# Patient Record
Sex: Male | Born: 1950 | Race: White | Hispanic: No | Marital: Married | Smoking: Current some day smoker
Health system: Southern US, Community
[De-identification: ages and names within clinical notes are randomized; demographics above are authoritative.]

## PROBLEM LIST (undated history)

## (undated) DIAGNOSIS — F329 Major depressive disorder, single episode, unspecified: Secondary | ICD-10-CM

## (undated) DIAGNOSIS — F32A Depression, unspecified: Secondary | ICD-10-CM

## (undated) DIAGNOSIS — F419 Anxiety disorder, unspecified: Secondary | ICD-10-CM

## (undated) HISTORY — PX: APPENDECTOMY: SHX54

---
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
## 2019-12-27 ENCOUNTER — Emergency Department: Payer: Medicare Other

## 2019-12-27 ENCOUNTER — Encounter: Payer: Self-pay | Admitting: Emergency Medicine

## 2019-12-27 ENCOUNTER — Emergency Department
Admission: EM | Admit: 2019-12-27 | Discharge: 2019-12-29 | Disposition: A | Discharge status: Home / Self Care | Payer: Medicare Other | Attending: Emergency Medicine | Admitting: Emergency Medicine

## 2019-12-27 ENCOUNTER — Other Ambulatory Visit: Payer: Self-pay

## 2019-12-27 DIAGNOSIS — Y939 Activity, unspecified: Secondary | ICD-10-CM | POA: Diagnosis not present

## 2019-12-27 DIAGNOSIS — F329 Major depressive disorder, single episode, unspecified: Secondary | ICD-10-CM | POA: Diagnosis not present

## 2019-12-27 DIAGNOSIS — R4182 Altered mental status, unspecified: Secondary | ICD-10-CM | POA: Diagnosis present

## 2019-12-27 DIAGNOSIS — Y929 Unspecified place or not applicable: Secondary | ICD-10-CM | POA: Diagnosis not present

## 2019-12-27 DIAGNOSIS — Y999 Unspecified external cause status: Secondary | ICD-10-CM | POA: Diagnosis not present

## 2019-12-27 DIAGNOSIS — S0990XA Unspecified injury of head, initial encounter: Secondary | ICD-10-CM | POA: Diagnosis present

## 2019-12-27 DIAGNOSIS — Z20822 Contact with and (suspected) exposure to covid-19: Secondary | ICD-10-CM | POA: Insufficient documentation

## 2019-12-27 DIAGNOSIS — S0101XA Laceration without foreign body of scalp, initial encounter: Secondary | ICD-10-CM | POA: Insufficient documentation

## 2019-12-27 DIAGNOSIS — W19XXXA Unspecified fall, initial encounter: Secondary | ICD-10-CM | POA: Insufficient documentation

## 2019-12-27 DIAGNOSIS — F17200 Nicotine dependence, unspecified, uncomplicated: Secondary | ICD-10-CM | POA: Diagnosis not present

## 2019-12-27 DIAGNOSIS — R443 Hallucinations, unspecified: Secondary | ICD-10-CM | POA: Diagnosis not present

## 2019-12-27 HISTORY — DX: Depression, unspecified: F32.A

## 2019-12-27 HISTORY — DX: Anxiety disorder, unspecified: F41.9

## 2019-12-27 LAB — RESPIRATORY PANEL BY RT PCR (FLU A&B, COVID)
Influenza A by PCR: NEGATIVE
Influenza B by PCR: NEGATIVE
SARS Coronavirus 2 by RT PCR: NEGATIVE

## 2019-12-27 LAB — COMPREHENSIVE METABOLIC PANEL
ALT: 21 U/L (ref 0–44)
AST: 29 U/L (ref 15–41)
Albumin: 3.8 g/dL (ref 3.5–5.0)
Alkaline Phosphatase: 75 U/L (ref 38–126)
Anion gap: 8 (ref 5–15)
BUN: 18 mg/dL (ref 8–23)
CO2: 27 mmol/L (ref 22–32)
Calcium: 9 mg/dL (ref 8.9–10.3)
Chloride: 100 mmol/L (ref 98–111)
Creatinine, Ser: 1.21 mg/dL (ref 0.61–1.24)
GFR calc Af Amer: >60 mL/min (ref >60)
GFR calc non Af Amer: >60 mL/min (ref >60)
Glucose, Bld: 95 mg/dL (ref 70–99)
Potassium: 4.6 mmol/L (ref 3.5–5.1)
Sodium: 135 mmol/L (ref 135–145)
Total Bilirubin: 0.9 mg/dL (ref 0.3–1.2)
Total Protein: 8.1 g/dL (ref 6.5–8.1)

## 2019-12-27 LAB — URINALYSIS, COMPLETE (UACMP) WITH MICROSCOPIC
Bacteria, UA: NONE SEEN
Bilirubin Urine: NEGATIVE
Glucose, UA: NEGATIVE mg/dL
Hgb urine dipstick: NEGATIVE
Ketones, ur: NEGATIVE mg/dL
Leukocytes,Ua: NEGATIVE
Nitrite: NEGATIVE
Protein, ur: NEGATIVE mg/dL
Specific Gravity, Urine: 1.015 (ref 1.005–1.030)
Squamous Epithelial / HPF: NONE SEEN (ref 0–5)
pH: 6 (ref 5.0–8.0)

## 2019-12-27 LAB — CBC WITH DIFFERENTIAL/PLATELET
Abs Immature Granulocytes: 0.03 10*3/uL (ref 0.00–0.07)
Basophils Absolute: 0.1 10*3/uL (ref 0.0–0.1)
Basophils Relative: 1 %
Eosinophils Absolute: 0.1 10*3/uL (ref 0.0–0.5)
Eosinophils Relative: 1 %
HCT: 33.9 % — ABNORMAL LOW (ref 39.0–52.0)
Hemoglobin: 11.2 g/dL — ABNORMAL LOW (ref 13.0–17.0)
Immature Granulocytes: 0 %
Lymphocytes Relative: 27 %
Lymphs Abs: 2.1 10*3/uL (ref 0.7–4.0)
MCH: 28.9 pg (ref 26.0–34.0)
MCHC: 33 g/dL (ref 30.0–36.0)
MCV: 87.4 fL (ref 80.0–100.0)
Monocytes Absolute: 0.6 10*3/uL (ref 0.1–1.0)
Monocytes Relative: 8 %
Neutro Abs: 4.7 10*3/uL (ref 1.7–7.7)
Neutrophils Relative %: 63 %
Platelets: 318 10*3/uL (ref 150–400)
RBC: 3.88 MIL/uL — ABNORMAL LOW (ref 4.22–5.81)
RDW: 12.6 % (ref 11.5–15.5)
WBC: 7.5 10*3/uL (ref 4.0–10.5)
nRBC: 0 % (ref 0.0–0.2)

## 2019-12-27 LAB — URINE DRUG SCREEN, QUALITATIVE (ARMC ONLY)
Amphetamines, Ur Screen: POSITIVE — AB
Barbiturates, Ur Screen: NOT DETECTED
Benzodiazepine, Ur Scrn: POSITIVE — AB
Cannabinoid 50 Ng, Ur ~~LOC~~: POSITIVE — AB
Cocaine Metabolite,Ur ~~LOC~~: NOT DETECTED
MDMA (Ecstasy)Ur Screen: NOT DETECTED
Methadone Scn, Ur: NOT DETECTED
Opiate, Ur Screen: NOT DETECTED
Phencyclidine (PCP) Ur S: NOT DETECTED
Tricyclic, Ur Screen: POSITIVE — AB

## 2019-12-27 LAB — ETHANOL: Alcohol, Ethyl (B): <10 mg/dL (ref <10)

## 2019-12-27 MED ORDER — HALOPERIDOL LACTATE 5 MG/ML IJ SOLN
2.0000 mg | Freq: Once | INTRAMUSCULAR | Status: AC
  Administered 2019-12-27: 2 mg via INTRAVENOUS
  Filled 2019-12-27: qty 1

## 2019-12-27 MED ORDER — LORAZEPAM 2 MG/ML IJ SOLN
1.0000 mg | Freq: Once | INTRAMUSCULAR | Status: AC
  Administered 2019-12-27: 16:00:00 1 mg via INTRAVENOUS
  Filled 2019-12-27: qty 1

## 2019-12-27 NOTE — ED Notes (Signed)
Patient went to another patient room and sat on the bed. Redirected by the  staff member. No issues

## 2019-12-27 NOTE — ED Notes (Signed)
Hourly rounding reveals patient in room. No complaints, stable, in no acute distress. Q15 minute rounds and monitoring via Rover and Officer to continue.   

## 2019-12-27 NOTE — ED Notes (Signed)
Dr Mayford Knife at bedside for staples.  Irregular shaped laceration to back of head.

## 2019-12-27 NOTE — ED Notes (Addendum)
Patient continues to disrupt the unit. EDP notified.

## 2019-12-27 NOTE — ED Notes (Signed)
Pt very unsteady on feet when getting to bed with EMS.

## 2019-12-27 NOTE — ED Provider Notes (Signed)
[facility] Emergency Department Provider Note       Time seen: ----------------------------------------- 3:43 PM on 12/27/2019 ----------------------------------------- Level V caveat: History/ROS limited by altered mental status I have reviewed the triage vital signs and the nursing notes. HISTORY   Chief Complaint Laceration   HPI Aaron Pineda is a 69 y.o. male with a history of anxiety and depression who presents to the ED for mechanical fall.  Patient fell and hit the back of his head.  He was noted to have garbled speech seems difficult to understand.  Patient describes discomfort 7 out of 10 in the back of his head.  Son states he has been hallucinating all day.  He has not had any recent illness.  Patient is altered on arrival  Past Medical History:  Diagnosis Date  . Anxiety   . Depression     There are no problems to display for this patient.   Past Surgical History:  Procedure Laterality Date  . APPENDECTOMY      Allergies Codeine  Social History Social History   Tobacco Use  . Smoking status: Current Some Day Smoker  . Smokeless tobacco: Never Used  Substance Use Topics  . Alcohol use: Not Currently  . Drug use: Never   Review of Systems Constitutional: Negative for fever. Cardiovascular: Negative for chest pain. Respiratory: Negative for shortness of breath. Gastrointestinal: Negative for abdominal pain, vomiting and diarrhea. Musculoskeletal: Negative for back pain. Skin: Positive for scalp laceration Neurological: Positive for headache  All systems negative/normal/unremarkable except as stated in the HPI  ____________________________________________   PHYSICAL EXAM:  VITAL SIGNS: ED Triage Vitals  Enc Vitals Group     BP 12/27/19 1517 (!) 152/68     Pulse Rate 12/27/19 1517 95     Resp 12/27/19 1517 18     Temp 12/27/19 1517 98 F (36.7 C)     Temp Source 12/27/19 1517 Oral     SpO2 12/27/19 1517 99 %   Weight 12/27/19 1518 120 lb (54.4 kg)     Height --      Head Circumference --      Peak Flow --      Pain Score 12/27/19 1517 7     Pain Loc --      Pain Edu? --      Excl. in GC? --     Constitutional: Alert but disoriented Eyes: Conjunctivae are normal. Normal extraocular movements. ENT      Head: Normocephalic and atraumatic.      Nose: No congestion/rhinnorhea.      Mouth/Throat: Mucous membranes are moist.      Neck: No stridor. Cardiovascular: Normal rate, regular rhythm. No murmurs, rubs, or gallops. Respiratory: Normal respiratory effort without tachypnea nor retractions. Breath sounds are clear and equal bilaterally. No wheezes/rales/rhonchi. Gastrointestinal: Soft and nontender. Normal bowel sounds Musculoskeletal: Nontender with normal range of motion in extremities. No lower extremity tenderness nor edema. Neurologic: Abnormal speech and behavior.  No gross focal neurologic deficits are appreciated.  Patient with apparent rhythmic movements of his mouth and tongue concerning for tardive dyskinesia Skin:  Skin is warm, dry and intact. No rash noted. Psychiatric: Bizarre mood and affect at times ____________________________________________  EKG: Interpreted by me.  Sinus rhythm with rate of 87 bpm, normal PR interval, normal QRS, normal QT  ____________________________________________  ED COURSE:  As part of my medical decision making, I reviewed the following data within the electronic MEDICAL RECORD NUMBER History obtained from family if  available, nursing notes, old chart and ekg, as well as notes from prior ED visits. Patient presented for altered mental status, fall and head injury, we will assess with labs and imaging as indicated at this time.   Marland Kitchen.Laceration Repair  Date/Time: 12/27/2019 3:55 PM Performed by: Earleen Newport, MD Authorized by: Earleen Newport, MD   Consent:    Consent obtained:  Emergent situation Anesthesia (see MAR for exact  dosages):    Anesthesia method:  Local infiltration   Local anesthetic:  Lidocaine 1% w/o epi Laceration details:    Location:  Scalp   Scalp location:  Occipital   Length (cm):  4   Depth (mm):  10 Repair type:    Repair type:  Intermediate Pre-procedure details:    Preparation:  Patient was prepped and draped in usual sterile fashion Treatment:    Area cleansed with:  Betadine   Amount of cleaning:  Standard   Irrigation solution:  Sterile saline   Visualized foreign bodies/material removed: no   Skin repair:    Repair method:  Sutures and staples   Number of sutures:  3   Number of staples:  8 Approximation:    Approximation:  Close Post-procedure details:    Dressing:  Open (no dressing)   Patient tolerance of procedure:  Tolerated well, no immediate complications    Aaron Pineda was evaluated in Emergency Department on 12/27/2019 for the symptoms described in the history of present illness. He was evaluated in the context of the global COVID-19 pandemic, which necessitated consideration that the patient might be at risk for infection with the SARS-CoV-2 virus that causes COVID-19. Institutional protocols and algorithms that pertain to the evaluation of patients at risk for COVID-19 are in a state of rapid change based on information released by regulatory bodies including the CDC and federal and state organizations. These policies and algorithms were followed during the patient's care in the ED.  ____________________________________________   LABS (pertinent positives/negatives)  Labs Reviewed  CBC WITH DIFFERENTIAL/PLATELET - Abnormal; Notable for the following components:      Result Value   RBC 3.88 (*)    Hemoglobin 11.2 (*)    HCT 33.9 (*)    All other components within normal limits  URINE DRUG SCREEN, QUALITATIVE ([facility] ONLY) - Abnormal; Notable for the following components:   Tricyclic, Ur Screen POSITIVE (*)    Amphetamines, Ur Screen POSITIVE (*)     Cannabinoid 50 Ng, Ur [place] POSITIVE (*)    Benzodiazepine, Ur Scrn POSITIVE (*)    All other components within normal limits  RESPIRATORY PANEL BY RT PCR (FLU A&B, COVID)  COMPREHENSIVE METABOLIC PANEL  ETHANOL  URINALYSIS, COMPLETE (UACMP) WITH MICROSCOPIC    RADIOLOGY Images were viewed by me  CT head  IMPRESSION:  No skull fracture. No acute intracranial finding. Generalized  atrophy.   Right posterior parietal scalp injury with staples.  ____________________________________________   DIFFERENTIAL DIAGNOSIS   Acute psychosis, intoxication, dehydration, electrolyte abnormality, subdural, skull fracture  FINAL ASSESSMENT AND PLAN  Altered mental status, head injury   Plan: The patient had presented for altered mental status with recent head injury. Patient's labs thus far have been unremarkable. Patient's imaging was reassuring.  Son states that he has been hallucinating and is not acting himself.  He is uncomfortable with taking him home, I will consult psychiatry for further evaluation.   Laurence Aly, MD    Note: This note was generated in part or whole with voice  recognition software. Voice recognition is usually quite accurate but there are transcription errors that can and very often do occur. I apologize for any typographical errors that were not detected and corrected.     Emily Filbert, MD 12/27/19 Harrietta Guardian

## 2019-12-27 NOTE — ED Notes (Addendum)
This RN called and spoke with pt's son Everhett Bozard [phone]. Per son, pt lives with him, pt's wife is in Diplomatic Services operational officer. St pt has been Rx  Amitriptyline 100mg  by his PCP, he was seen by his PCP last week. Son st pt takes 4 to 5 Amitriptyline daily as a sleep aid. Son st pt "got an attitude, mood swings, his motor skills are off every time he takes amitriptyline". Pt  Son reports pt having HI on Sunday statying objects are in the ceiling; son denies physical threats or self harm.  Son st dentures do not work for him; pt eats pureed consistency foods.

## 2019-12-27 NOTE — ED Notes (Signed)
Pt dressed out into appropriate behavioral health clothing in the interview rm with this tech and Mimi,EDT in the rm. Pt belongings consist of a blue vest, a gray shirt, black pants, black shoes, a white metal bracelet, black socks, white underwear and a pair of gray long john pants. Pt calm and cooperative while dressing out. Pt belongings placed into a pt belongings bag and labeled with pt name. Pt ambulated to 19H. Yessica,RN aware of pt.

## 2019-12-27 NOTE — ED Notes (Signed)
Report to include Situation, Background, Assessment, and Recommendations received from [facility]. Patient alert and oriented, warm and dry, in no acute distress. Unable to assess SI, HI, AVH and pain. Hard to understand. Patient don't have his denture. Patient made aware of Q15 minute rounds and Psychologist, counselling presence for their safety. Patient instructed to come to me with needs or concerns.

## 2019-12-27 NOTE — ED Notes (Addendum)
Pt had mechanical fall per EMS.  Attempted to call son X 2, no answer.  Have not updated history as cannot understand patients speech.  Appears to have a tardive dyskinesia type movement.  Per son pt not acting himself all day.  Son reports that pt has been seeing people in the house that are not there and talking to people not there.   According to son only medical hx is anxiety and depression.  Does have problems with speech when taking his amitriptyline.

## 2019-12-27 NOTE — ED Triage Notes (Signed)
Per EMS report had a mechanical fall with laceration to back of head.  Pt has garbled speech and difficult to understand pt.  Has non-purposeful movements similar to someone with tardive dyskinesia.  Difficult to assess orientation r/t speech.

## 2019-12-28 DIAGNOSIS — R4182 Altered mental status, unspecified: Secondary | ICD-10-CM | POA: Diagnosis present

## 2019-12-28 DIAGNOSIS — R443 Hallucinations, unspecified: Secondary | ICD-10-CM | POA: Diagnosis present

## 2019-12-28 NOTE — Consult Note (Signed)
[facility] Face-to-Face Psychiatry Consult   Reason for Consult:  Laceration Referring Physician:  Dr. Mayford Knife Patient Identification: Aaron Pineda MRN:  [id number] Principal Diagnosis: AMS (altered mental status) Diagnosis:  Principal Problem:   AMS (altered mental status) Active Problems:   Hallucination   Total Time spent with patient: 30 minutes  Subjective:   Aaron Pineda is a 69 y.o. male patient presented to [facility] ED via EMS due to a fall. The patient was seen face-to-face by this provider; chart reviewed and consulted with Dr.Paudhauski  on 12/27/2019 due to the care of the patient. It was discussed with both providers that the patient does meet the criteria to be admitted to the inpatient unit.  On evaluation, the patient is alert, anxious, but cooperative, and mood-congruent with affect.  The patient does appear to be responding to internal and external stimuli. The patient is presenting with delusional thinking. The patient denies auditory or visual hallucinations. The patient denies any suicidal, homicidal, or self-harm ideations. The patient is not presenting with any psychotic or paranoid behaviors. During an encounter with the patient, he was unable to answer questions appropriately.   HPI:  Per Dr. Mayford Knife; Aaron Pineda is a 69 y.o. male with a history of anxiety and depression who presents to the ED for mechanical fall.  Patient fell and hit the back of his head.  He was noted to have garbled speech seems difficult to understand.  Patient describes discomfort 7 out of 10 in the back of his head.  Son states he has been hallucinating all day.  He has not had any recent illness.  Patient is altered on arrival  Past Psychiatric History:   Risk to Self:   Yes Risk to Others:   No Prior Inpatient Therapy:   Unknown Prior Outpatient Therapy:   Unknown  Past Medical History:  Past Medical History:  Diagnosis Date  . Anxiety   . Depression     Past Surgical History:   Procedure Laterality Date  . APPENDECTOMY     Family History: History reviewed. No pertinent family history. Family Psychiatric  History:  Social History:  Social History   Substance and Sexual Activity  Alcohol Use Not Currently     Social History   Substance and Sexual Activity  Drug Use Never    Social History   Socioeconomic History  . Marital status: Married    Spouse name: Not on file  . Number of children: Not on file  . Years of education: Not on file  . Highest education level: Not on file  Occupational History  . Not on file  Tobacco Use  . Smoking status: Current Some Day Smoker  . Smokeless tobacco: Never Used  Substance and Sexual Activity  . Alcohol use: Not Currently  . Drug use: Never  . Sexual activity: Not on file  Other Topics Concern  . Not on file  Social History Narrative  . Not on file   Social Determinants of Health   Financial Resource Strain:   . Difficulty of Paying Living Expenses: Not on file  Food Insecurity:   . Worried About Programme researcher, broadcasting/film/video in the Last Year: Not on file  . Ran Out of Food in the Last Year: Not on file  Transportation Needs:   . Lack of Transportation (Medical): Not on file  . Lack of Transportation (Non-Medical): Not on file  Physical Activity:   . Days of Exercise per Week: Not on file  . Minutes of Exercise  per Session: Not on file  Stress:   . Feeling of Stress : Not on file  Social Connections:   . Frequency of Communication with Friends and Family: Not on file  . Frequency of Social Gatherings with Friends and Family: Not on file  . Attends Religious Services: Not on file  . Active Member of Clubs or Organizations: Not on file  . Attends Banker Meetings: Not on file  . Marital Status: Not on file   Additional Social History:    Allergies:   Allergies  Allergen Reactions  . Codeine Nausea Only    Labs:  Results for orders placed or performed during the hospital encounter of  12/27/19 (from the past 48 hour(s))  CBC with Differential     Status: Abnormal   Collection Time: 12/27/19  3:59 PM  Result Value Ref Range   WBC 7.5 4.0 - 10.5 K/uL   RBC 3.88 (L) 4.22 - 5.81 MIL/uL   Hemoglobin 11.2 (L) 13.0 - 17.0 g/dL   HCT 91.4 (L) 78.2 - 95.6 %   MCV 87.4 80.0 - 100.0 fL   MCH 28.9 26.0 - 34.0 pg   MCHC 33.0 30.0 - 36.0 g/dL   RDW 21.3 08.6 - 57.8 %   Platelets 318 150 - 400 K/uL   nRBC 0.0 0.0 - 0.2 %   Neutrophils Relative % 63 %   Neutro Abs 4.7 1.7 - 7.7 K/uL   Lymphocytes Relative 27 %   Lymphs Abs 2.1 0.7 - 4.0 K/uL   Monocytes Relative 8 %   Monocytes Absolute 0.6 0.1 - 1.0 K/uL   Eosinophils Relative 1 %   Eosinophils Absolute 0.1 0.0 - 0.5 K/uL   Basophils Relative 1 %   Basophils Absolute 0.1 0.0 - 0.1 K/uL   Immature Granulocytes 0 %   Abs Immature Granulocytes 0.03 0.00 - 0.07 K/uL    Comment: Performed at [facility], [address] [place], [place] [address]  Comprehensive metabolic panel     Status: None   Collection Time: 12/27/19  3:59 PM  Result Value Ref Range   Sodium 135 135 - 145 mmol/L   Potassium 4.6 3.5 - 5.1 mmol/L   Chloride 100 98 - 111 mmol/L   CO2 27 22 - 32 mmol/L   Glucose, Bld 95 70 - 99 mg/dL   BUN 18 8 - 23 mg/dL   Creatinine, Ser 9.52 0.61 - 1.24 mg/dL   Calcium 9.0 8.9 - 84.1 mg/dL   Total Protein 8.1 6.5 - 8.1 g/dL   Albumin 3.8 3.5 - 5.0 g/dL   AST 29 15 - 41 U/L   ALT 21 0 - 44 U/L   Alkaline Phosphatase 75 38 - 126 U/L   Total Bilirubin 0.9 0.3 - 1.2 mg/dL   GFR calc non Af Amer >60 >60 mL/min   GFR calc Af Amer >60 >60 mL/min   Anion gap 8 5 - 15    Comment: Performed at [facility], [address] [place], [place] [address]  Ethanol     Status: None   Collection Time: 12/27/19  3:59 PM  Result Value Ref Range   Alcohol, Ethyl (B) <10 <10 mg/dL    Comment: (NOTE) Lowest detectable limit for serum alcohol is 10 mg/dL. For medical purposes only. Performed at [facility], [address] [place], [place] [address]   Respiratory Panel by RT PCR (Flu A&B, Covid) - Nasopharyngeal Swab     Status:  None   Collection Time: 12/27/19  3:59 PM   Specimen: Nasopharyngeal Swab  Result Value Ref Range   SARS Coronavirus 2 by RT PCR NEGATIVE NEGATIVE    Comment: (NOTE) SARS-CoV-2 target nucleic acids are NOT DETECTED. The SARS-CoV-2 RNA is generally detectable in upper respiratoy specimens during the acute phase of infection. The lowest concentration of SARS-CoV-2 viral copies this assay can detect is 131 copies/mL. A negative result does not preclude SARS-Cov-2 infection and should not be used as the sole basis for treatment or other patient management decisions. A negative result may occur with  improper specimen collection/handling, submission of specimen other than nasopharyngeal swab, presence of viral mutation(s) within the areas targeted by this assay, and inadequate number of viral copies (<131 copies/mL). A negative result must be combined with clinical observations, patient history, and epidemiological information. The expected result is Negative. Fact Sheet for Patients:  PinkCheek.be Fact Sheet for Healthcare Providers:  GravelBags.it This test is not yet ap proved or cleared by the [place] FDA and  has been authorized for detection and/or diagnosis of SARS-CoV-2 by FDA under an Emergency Use Authorization (EUA). This EUA will remain  in effect (meaning this test can be used) for the duration of the COVID-19 declaration under Section 564(b)(1) of the Act, 21 U.S.C. section 360bbb-3(b)(1), unless the authorization is terminated or revoked sooner.    Influenza A by PCR NEGATIVE NEGATIVE   Influenza B by PCR NEGATIVE NEGATIVE    Comment: (NOTE) The Xpert Xpress SARS-CoV-2/FLU/RSV assay is intended as an aid in  the diagnosis of influenza from Nasopharyngeal swab specimens and   should not be used as a sole basis for treatment. Nasal washings and  aspirates are unacceptable for Xpert Xpress SARS-CoV-2/FLU/RSV  testing. Fact Sheet for Patients: PinkCheek.be Fact Sheet for Healthcare Providers: GravelBags.it This test is not yet approved or cleared by the [place] FDA and  has been authorized for detection and/or diagnosis of SARS-CoV-2 by  FDA under an Emergency Use Authorization (EUA). This EUA will remain  in effect (meaning this test can be used) for the duration of the  Covid-19 declaration under Section 564(b)(1) of the Act, 21  U.S.C. section 360bbb-3(b)(1), unless the authorization is  terminated or revoked. Performed at [facility], [address] [place], [place] [address]   Urinalysis, Complete w Microscopic     Status: Abnormal   Collection Time: 12/27/19  4:45 PM  Result Value Ref Range   Color, Urine YELLOW (A) YELLOW   APPearance CLEAR (A) CLEAR   Specific Gravity, Urine 1.015 1.005 - 1.030   pH 6.0 5.0 - 8.0   Glucose, UA NEGATIVE NEGATIVE mg/dL   Hgb urine dipstick NEGATIVE NEGATIVE   Bilirubin Urine NEGATIVE NEGATIVE   Ketones, ur NEGATIVE NEGATIVE mg/dL   Protein, ur NEGATIVE NEGATIVE mg/dL   Nitrite NEGATIVE NEGATIVE   Leukocytes,Ua NEGATIVE NEGATIVE   RBC / HPF 0-5 0 - 5 RBC/hpf   WBC, UA 0-5 0 - 5 WBC/hpf   Bacteria, UA NONE SEEN NONE SEEN   Squamous Epithelial / LPF NONE SEEN 0 - 5   Mucus PRESENT     Comment: Performed at [facility], [address] [place], [place] [address]  Urine Drug Screen, Qualitative ([facility] only)     Status: Abnormal   Collection Time: 12/27/19  4:45 PM  Result Value Ref Range   Tricyclic, Ur Screen POSITIVE (A) NONE DETECTED   Amphetamines, Ur Screen POSITIVE (A) NONE DETECTED  MDMA (Ecstasy)Ur Screen NONE DETECTED NONE DETECTED   Cocaine Metabolite,Ur [place] NONE DETECTED NONE DETECTED   Opiate, Ur Screen NONE  DETECTED NONE DETECTED   Phencyclidine (PCP) Ur S NONE DETECTED NONE DETECTED   Cannabinoid 50 Ng, Ur [place] POSITIVE (A) NONE DETECTED   Barbiturates, Ur Screen NONE DETECTED NONE DETECTED   Benzodiazepine, Ur Scrn POSITIVE (A) NONE DETECTED   Methadone Scn, Ur NONE DETECTED NONE DETECTED    Comment: (NOTE) Tricyclics + metabolites, urine    Cutoff 1000 ng/mL Amphetamines + metabolites, urine  Cutoff 1000 ng/mL MDMA (Ecstasy), urine              Cutoff 500 ng/mL Cocaine Metabolite, urine          Cutoff 300 ng/mL Opiate + metabolites, urine        Cutoff 300 ng/mL Phencyclidine (PCP), urine         Cutoff 25 ng/mL Cannabinoid, urine                 Cutoff 50 ng/mL Barbiturates + metabolites, urine  Cutoff 200 ng/mL Benzodiazepine, urine              Cutoff 200 ng/mL Methadone, urine                   Cutoff 300 ng/mL The urine drug screen provides only a preliminary, unconfirmed analytical test result and should not be used for non-medical purposes. Clinical consideration and professional judgment should be applied to any positive drug screen result due to possible interfering substances. A more specific alternate chemical method must be used in order to obtain a confirmed analytical result. Gas chromatography / mass spectrometry (GC/MS) is the preferred confirmat ory method. Performed at [facility], [address] [place], [place] [address]     No current facility-administered medications for this encounter.   No current outpatient medications on file.    Musculoskeletal: Strength & Muscle Tone: decreased Gait & Station: unsteady Patient leans: N/A  Psychiatric Specialty Exam: Physical Exam  Nursing note and vitals reviewed. Musculoskeletal:        General: Deformity present.     Cervical back: Normal range of motion.  Neurological: He is alert.    Review of Systems  Psychiatric/Behavioral: Positive for agitation, behavioral problems, confusion,  hallucinations and sleep disturbance.  All other systems reviewed and are negative.   Blood pressure (!) 141/86, pulse (!) 102, temperature 98.3 F (36.8 C), temperature source Oral, resp. rate 20, weight 54.4 kg, SpO2 98 %.There is no height or weight on file to calculate BMI.  General Appearance: Disheveled  Eye Contact:  Absent  Speech:  Slurred  Volume:  Decreased  Mood:  Anxious, Euphoric and Hopeless  Affect:  Appropriate  Thought Process:  Disorganized  Orientation:  Other:  self  Thought Content:  Illogical and Delusions  Suicidal Thoughts:  No  Homicidal Thoughts:  No  Memory:  Immediate;   Poor Recent;   Poor Remote;   Poor  Judgement:  Poor  Insight:  Lacking  Psychomotor Activity:  Decreased  Concentration:  Concentration: Poor and Attention Span: Poor  Recall:  Poor  Fund of Knowledge:  Poor  Language:  Poor  Akathisia:  Negative  Handed:  Right  AIMS (if indicated):     Assets:  Desire for Improvement Financial Resources/Insurance Housing  ADL's:  Impaired  Cognition:  Impaired,  Mild  Sleep:        Treatment Plan Summary: Daily contact  with patient to assess and evaluate symptoms and progress in treatment, Medication management and Plan Patient meets cateria for psychiatric admission  Disposition: Recommend psychiatric Inpatient admission when medically cleared. Supportive therapy provided about ongoing stressors.  Gillermo MurdochJacqueline Bradi [place], NP 12/28/2019 5:48 AM

## 2019-12-28 NOTE — ED Notes (Signed)
Patients wife(Zekee) called from assisted living facility, personal phone609 [phone].

## 2019-12-28 NOTE — ED Notes (Signed)
Pt  Vol  Pending  placement 

## 2019-12-28 NOTE — ED Notes (Signed)
Pt. Currently sleeping in 19 H bed.

## 2019-12-28 NOTE — ED Notes (Signed)
Pt woken up and walked to bathroom with this RN and Shanda Bumps, Charity fundraiser. Pt unsteady on his feet but able to make it with x2 assist. Pt reports being dizzy but hasn't been up all day. Pt reports not being hungry at this time. Pt updated on what happened last night due to pt not remembering. Pt unable to urinate. Pt walked back to bed and sheets changed. Pt given new blankets and went back to sleep.

## 2019-12-28 NOTE — ED Notes (Signed)
Hourly rounding reveals patient in room. No complaints, stable, in no acute distress. Q15 minute rounds and monitoring via Rover and Officer to continue.   

## 2019-12-28 NOTE — ED Notes (Signed)
Pt easily arousable to drink a cup of water. Pt eats a cookie. Pt asks where he is and this RN tells him [facility]. Pt nods okay. Pt states he does not have to urinate at this time.

## 2019-12-28 NOTE — ED Provider Notes (Signed)
-----------------------------------------   7:58 AM on 12/28/2019 -----------------------------------------  BP (!) 141/86 (BP Location: Right Arm)   Pulse (!) 102   Temp 98.3 F (36.8 C) (Oral)   Resp 20   Wt 54.4 kg   SpO2 98%   The patient is calm and cooperative at this time.  There have been no acute events since the last update.  Awaiting disposition plan from Behavioral Medicine and/or Social Work team(s).   Miguel Aschoff., MD 12/28/19 [phone]

## 2019-12-28 NOTE — ED Notes (Signed)
Pt arousable by shaking and voice. Pt calm and cooperative for vitals. Pt goes back to sleep.

## 2019-12-28 NOTE — ED Notes (Signed)
Son updated on pt care.

## 2019-12-28 NOTE — ED Notes (Signed)
A 

## 2019-12-28 NOTE — ED Notes (Signed)
TTS has spoken with the pts nurse regarding coordination of psych assessment. Pt has received mediation and is currently somnolent.  Unable to complete a thorough face to face assessment at this time.

## 2019-12-29 NOTE — ED Notes (Signed)
Pt. Woke up, pt. Assisted to bathroom and assisted back to bed.  Pt. Unsteady on feet.  Vitals taken.  Pt. Given cup of water, Apple sauce and an ensure drink.

## 2019-12-29 NOTE — ED Notes (Signed)
Attempted to call his son Alycia Rossetti to inform him that his father is going to [facility] this am  Unable to leave a message  [phone]

## 2019-12-29 NOTE — ED Notes (Signed)
Patient has been accepted to [facility].  Patient assigned to room [facility]  Accepting physician is Dr. Estill Cotta.  Call report to [phone].  Representative was [employer].   ER Staff is aware of it:  Neurosurgeon  Dr. ER MD  Amy Patient's Nurse

## 2019-12-29 NOTE — ED Notes (Signed)
ED BH Is the patient under IVC or is there intent for IVC:  voluntary  Is the patient medically cleared: Yes.   Is there vacancy in the ED BHU: Yes.   Is the population mix appropriate for patient:  He is a high fall risk - requires assistance with ambulation  Is the patient awaiting placement in inpatient or outpatient setting: Yes.   He has been accepted to [facility]  Has the patient had a psychiatric consult: Yes.   Survey of unit performed for contraband, proper placement and condition of furniture, tampering with fixtures in bathroom, shower, and each patient room: Yes.  ; Findings:  APPEARANCE/BEHAVIOR Calm and cooperative NEURO ASSESSMENT Orientation: oriented x3  Denies pain Hallucinations: No.None noted (Hallucinations) denies   Speech: Normal Gait: normal RESPIRATORY ASSESSMENT Even  Unlabored respirations  CARDIOVASCULAR ASSESSMENT Pulses equal   regular rate  Skin warm and dry   GASTROINTESTINAL ASSESSMENT no GI complaint EXTREMITIES Full ROM  PLAN OF CARE Provide calm/safe environment. Vital signs assessed twice daily. ED BHU Assessment once each 12-hour shift. Assure the ED provider has rounded once each shift. Provide and encourage hygiene. Provide redirection as needed. Assess for escalating behavior; address immediately and inform ED provider.  Assess family dynamic and appropriateness for visitation as needed: Yes.  ; If necessary, describe findings:  Educate the patient/family about BHU procedures/visitation: Yes.  ; If necessary, describe findings:

## 2019-12-29 NOTE — ED Notes (Signed)
Referral information for Psychiatric Hospitalization faxed to;   . [facility] ([phone]-or- [phone]),   . [facility] [phone] -or- [phone]) [phone]  . [facility] ([phone], [phone], [phone] or [phone]),   . [facility] ([phone]),    . [facility] ([phone] -or- [phone]),   . [facility] ([phone] or [phone])  . [facility] ([phone] or [phone]),   . [facility] ([phone]).  . [facility] ([phone])    

## 2019-12-29 NOTE — ED Notes (Signed)
BEHAVIORAL HEALTH ROUNDING Patient sleeping: Yes.   Patient alert and oriented: eyes closed  Appears asleep Behavior appropriate: Yes.  ; If no, describe:  Nutrition and fluids offered: Yes  Toileting and hygiene offered: sleeping Sitter present: q 15 minute observations  Law enforcement present: yes  [employer]   ENVIRONMENTAL ASSESSMENT Potentially harmful objects out of patient reach: Yes.   Personal belongings secured: Yes.   Patient dressed in hospital provided attire only: Yes.   Plastic bags out of patient reach: Yes.   Patient care equipment (cords, cables, call bells, lines, and drains) shortened, removed, or accounted for: Yes.   Equipment and supplies removed from bottom of stretcher: Yes.   Potentially toxic materials out of patient reach: Yes.   Sharps container removed or out of patient reach: Yes.   

## 2019-12-29 NOTE — ED Notes (Signed)
Patient observed lying in a hallway bed with eyes closed  Even, unlabored respirations observed   NAD pt appears to be sleeping  I will continue to monitor along with every 15 minute visual observations

## 2019-12-29 NOTE — ED Notes (Signed)
[facility]FE TRANSPORT CALLED  

## 2019-12-29 NOTE — ED Provider Notes (Signed)
-----------------------------------------   7:10 AM on 12/29/2019 -----------------------------------------   Blood pressure 114/61, pulse (!) 116, temperature 98.1 F (36.7 C), temperature source Oral, resp. rate 18, weight 54.4 kg, SpO2 100 %.  The patient is calm and cooperative at this time.  There have been no acute events since the last update.  Awaiting disposition plan from Behavioral Medicine and/or Social Work team(s).   Dionne Bucy, MD 12/29/19 [phone]

## 2019-12-29 NOTE — ED Notes (Signed)
Called the home number listed and spoke with his son  - told him the phone number and address to [facility]  Questions answered

## 2020-06-15 IMAGING — CT CT HEAD W/O CM
3 of 4 series · 16 of 47 positions shown, 19 images · non-contrast
Comparison: 09/05/2019

CLINICAL DATA: Fall with head trauma. Speech disturbance.
Encephalopathy.

EXAM:
CT HEAD WITHOUT CONTRAST
TECHNIQUE: Contiguous axial images were obtained from the base of the skull
through the vertex without intravenous contrast.

[Series 3: head wo · axial · 0.46mm/px · z∈[-123,+12]mm · 10 of 33 slices shown, 13 images]
[im 4/33  brain]
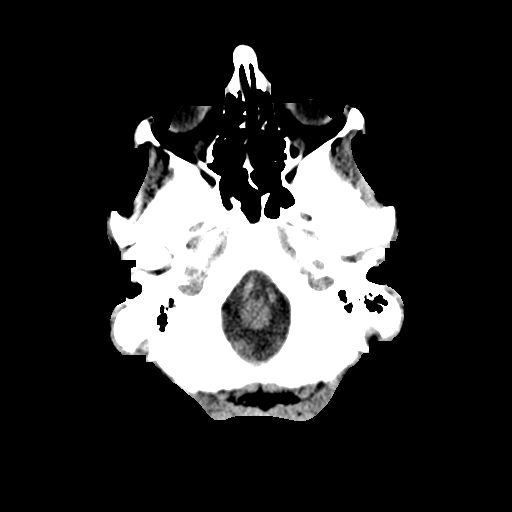
[im 4/33  bone]
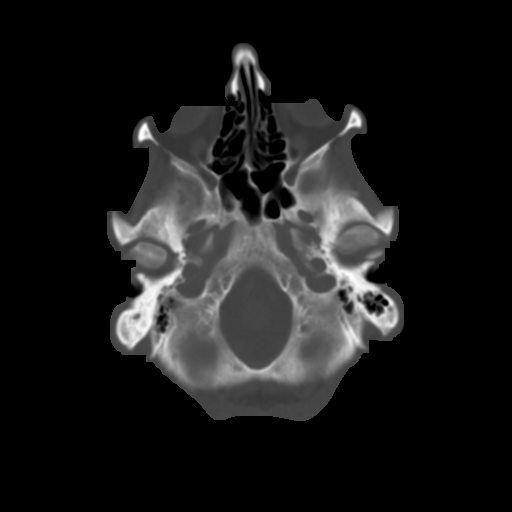
[im 7/33  brain]
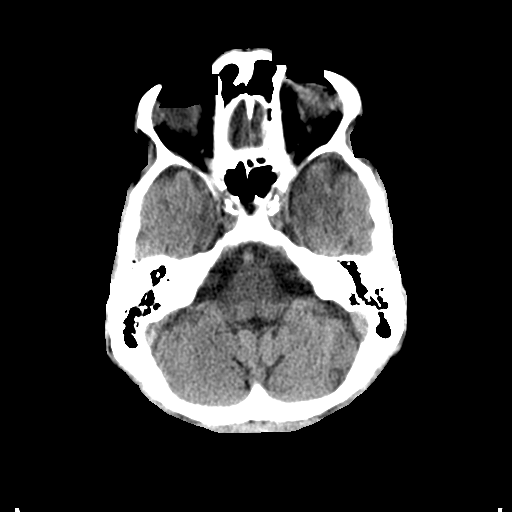
[im 10/33  brain]
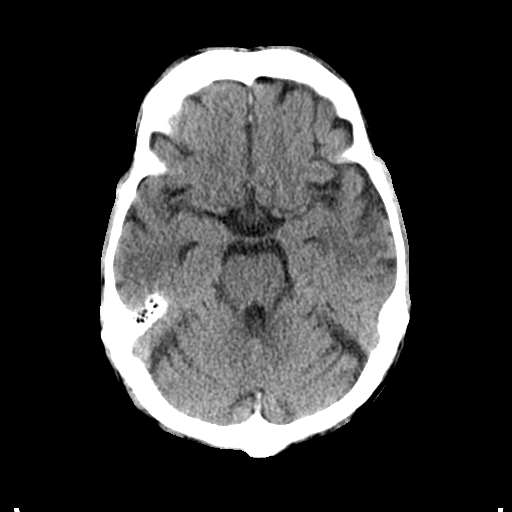
[im 13/33  brain]
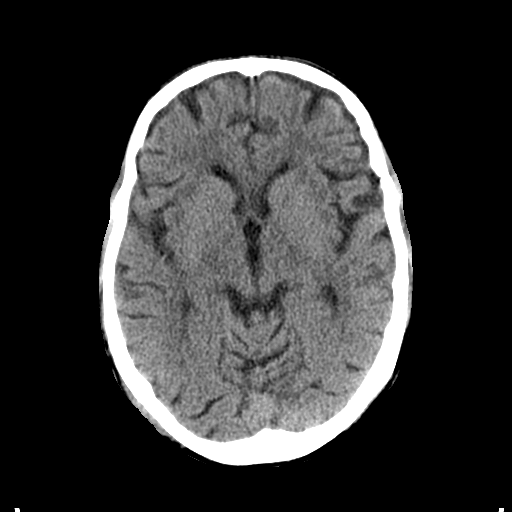
[im 16/33  brain]
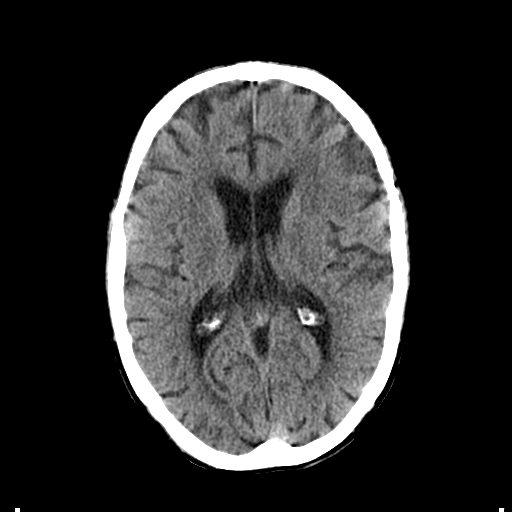
[im 16/33  bone]
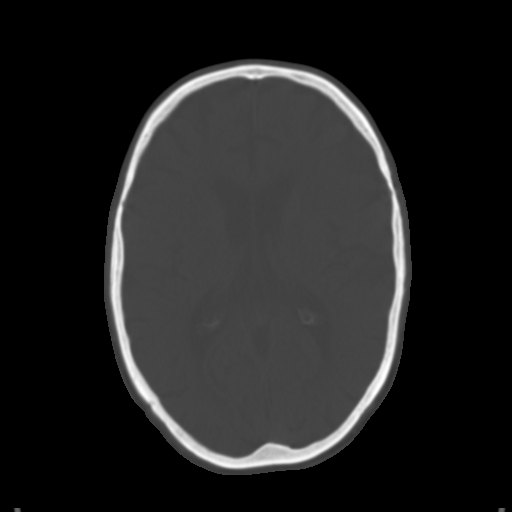
[im 19/33  brain]
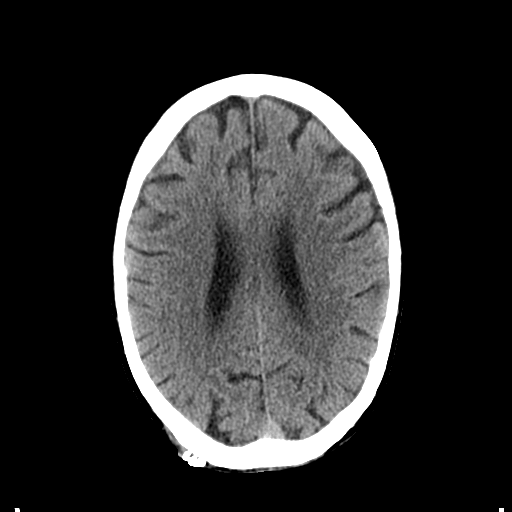
[im 22/33  brain]
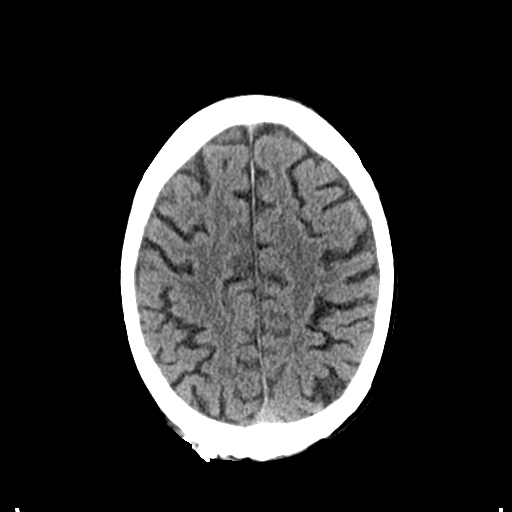
[im 25/33  brain]
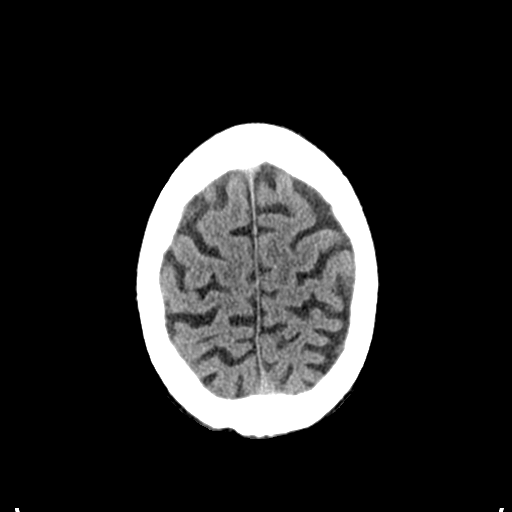
[im 28/33  brain]
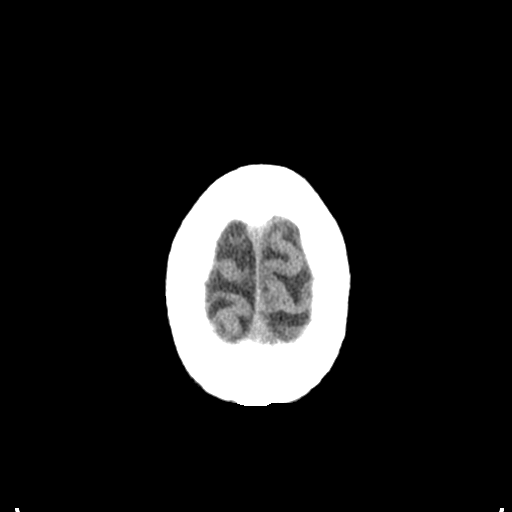
[im 28/33  bone]
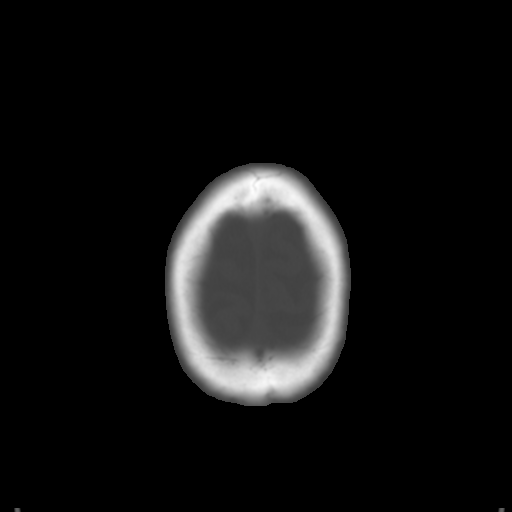
[im 31/33  brain]
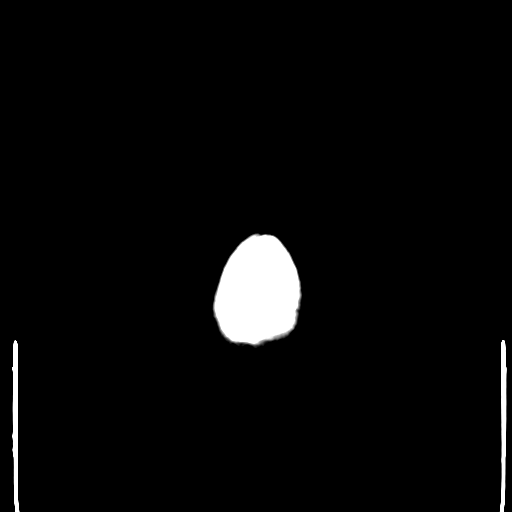

[Series 4: coronal soft tissue · coronal · 0.31mm/px · 3 of 65 slices shown]
[im 22/65  brain]
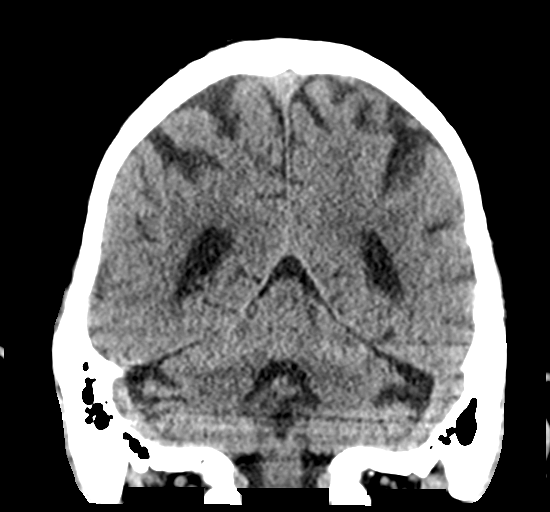
[im 29/65  brain]
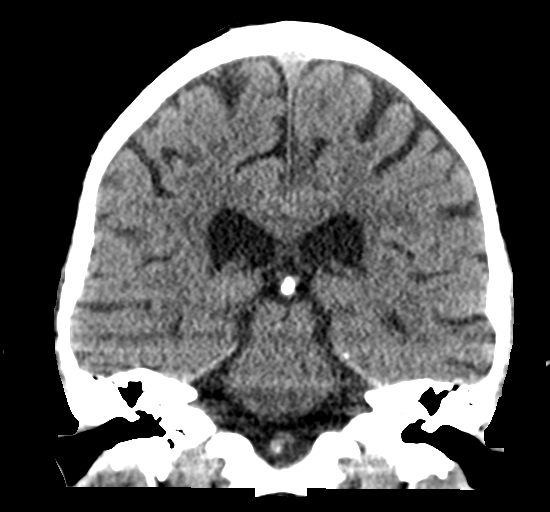
[im 36/65  brain]
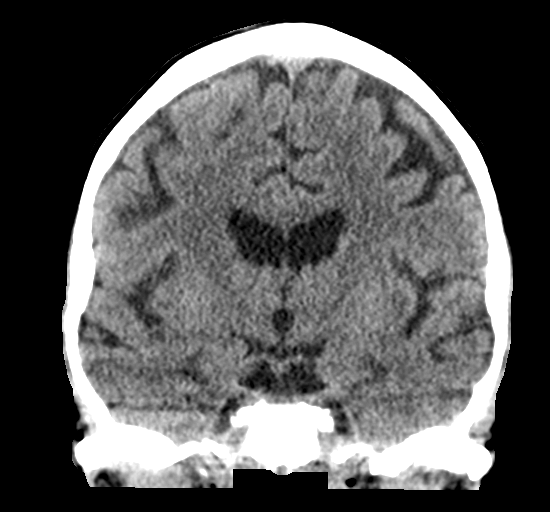

[Series 5: sagittal soft tissue · sagittal · 0.33mm/px · 3 of 50 slices shown]
[im 17/50  brain]
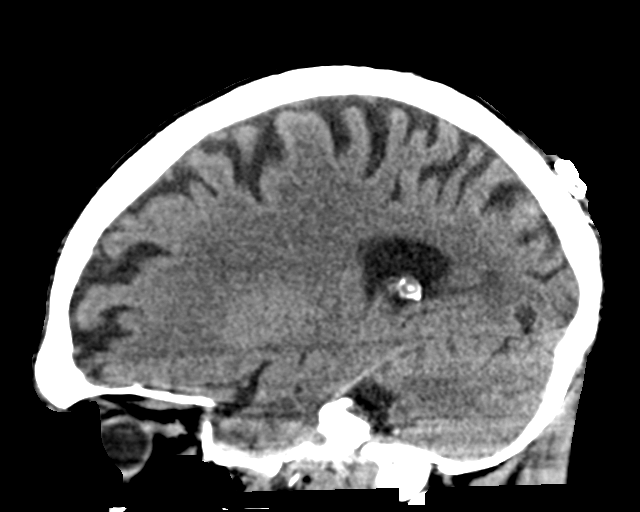
[im 25/50  brain]
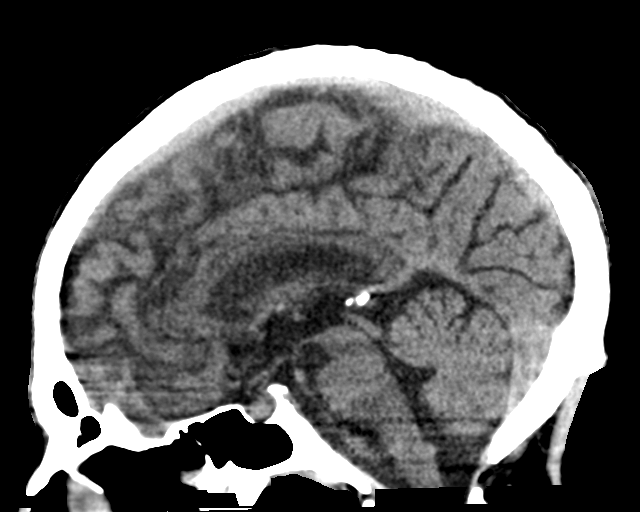
[im 33/50  brain]
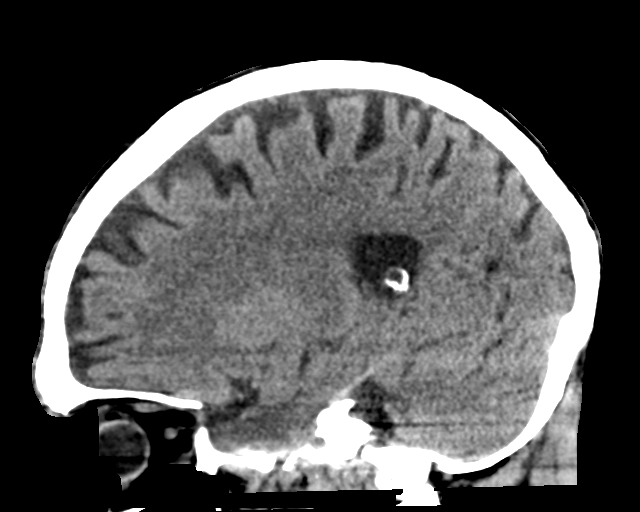

[16 of 47 positions shown; findings below may reference images not displayed]

FINDINGS: Brain: Generalized atrophy. No sign of acute infarction, mass
lesion, hemorrhage, hydrocephalus or extra-axial collection.

Vascular: There is atherosclerotic calcification of the major
vessels at the base of the brain.

Skull: Negative

Sinuses/Orbits: Clear/normal

Other: Right posterior parietal scalp injury with staples.
IMPRESSION: No skull fracture. No acute intracranial finding. Generalized
atrophy.

Right posterior parietal scalp injury with staples.

## 2020-09-14 DEATH — deceased
